# Patient Record
Sex: Female | Born: 2000 | Race: Black or African American | Hispanic: No | Marital: Single | Smoking: Never smoker
Health system: Southern US, Community
[De-identification: ages and names within clinical notes are randomized; demographics above are authoritative.]

---
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
## 2022-06-02 ENCOUNTER — Other Ambulatory Visit: Payer: Self-pay

## 2022-06-02 ENCOUNTER — Emergency Department: Payer: Medicaid Other

## 2022-06-02 ENCOUNTER — Emergency Department
Admission: EM | Admit: 2022-06-02 | Discharge: 2022-06-02 | Disposition: A | Discharge status: Home / Self Care | Payer: Medicaid Other | Attending: Emergency Medicine | Admitting: Emergency Medicine

## 2022-06-02 ENCOUNTER — Encounter: Payer: Self-pay | Admitting: Emergency Medicine

## 2022-06-02 DIAGNOSIS — Z6711 Type A blood, Rh negative: Secondary | ICD-10-CM | POA: Diagnosis not present

## 2022-06-02 DIAGNOSIS — R8281 Pyuria: Secondary | ICD-10-CM | POA: Diagnosis not present

## 2022-06-02 DIAGNOSIS — D649 Anemia, unspecified: Secondary | ICD-10-CM | POA: Insufficient documentation

## 2022-06-02 DIAGNOSIS — N939 Abnormal uterine and vaginal bleeding, unspecified: Secondary | ICD-10-CM | POA: Insufficient documentation

## 2022-06-02 LAB — CBC WITH DIFFERENTIAL/PLATELET
Abs Immature Granulocytes: 0.03 10*3/uL (ref 0.00–0.07)
Basophils Absolute: 0 10*3/uL (ref 0.0–0.1)
Basophils Relative: 0 %
Eosinophils Absolute: 0.2 10*3/uL (ref 0.0–0.5)
Eosinophils Relative: 2 %
HCT: 27 % — ABNORMAL LOW (ref 36.0–46.0)
Hemoglobin: 7.8 g/dL — ABNORMAL LOW (ref 12.0–15.0)
Immature Granulocytes: 0 %
Lymphocytes Relative: 27 %
Lymphs Abs: 2.3 10*3/uL (ref 0.7–4.0)
MCH: 21.8 pg — ABNORMAL LOW (ref 26.0–34.0)
MCHC: 28.9 g/dL — ABNORMAL LOW (ref 30.0–36.0)
MCV: 75.6 fL — ABNORMAL LOW (ref 80.0–100.0)
Monocytes Absolute: 0.5 10*3/uL (ref 0.1–1.0)
Monocytes Relative: 6 %
Neutro Abs: 5.7 10*3/uL (ref 1.7–7.7)
Neutrophils Relative %: 65 %
Platelets: 301 10*3/uL (ref 150–400)
RBC: 3.57 MIL/uL — ABNORMAL LOW (ref 3.87–5.11)
RDW: 15.6 % — ABNORMAL HIGH (ref 11.5–15.5)
WBC: 8.7 10*3/uL (ref 4.0–10.5)
nRBC: 0 % (ref 0.0–0.2)

## 2022-06-02 LAB — URINALYSIS, ROUTINE W REFLEX MICROSCOPIC
RBC / HPF: >50 RBC/hpf — ABNORMAL HIGH (ref 0–5)
Specific Gravity, Urine: >1.03 — ABNORMAL HIGH (ref 1.005–1.030)
WBC, UA: >50 WBC/hpf — ABNORMAL HIGH (ref 0–5)

## 2022-06-02 LAB — TSH: TSH: 2.746 u[IU]/mL (ref 0.350–4.500)

## 2022-06-02 LAB — PREGNANCY, URINE: Preg Test, Ur: NEGATIVE

## 2022-06-02 LAB — BASIC METABOLIC PANEL
Anion gap: 6 (ref 5–15)
BUN: 8 mg/dL (ref 6–20)
CO2: 24 mmol/L (ref 22–32)
Calcium: 8.6 mg/dL — ABNORMAL LOW (ref 8.9–10.3)
Chloride: 108 mmol/L (ref 98–111)
Creatinine, Ser: 0.7 mg/dL (ref 0.44–1.00)
GFR, Estimated: >60 mL/min (ref >60)
Glucose, Bld: 95 mg/dL (ref 70–99)
Potassium: 3.8 mmol/L (ref 3.5–5.1)
Sodium: 138 mmol/L (ref 135–145)

## 2022-06-02 LAB — TYPE AND SCREEN
ABO/RH(D): A NEG
Antibody Screen: NEGATIVE

## 2022-06-02 LAB — HCG, QUANTITATIVE, PREGNANCY: hCG, Beta Chain, Quant, S: <1 m[IU]/mL (ref <5)

## 2022-06-02 MED ORDER — DOCUSATE SODIUM 100 MG PO CAPS: 100.0000 mg | ORAL_CAPSULE | Freq: Two times a day (BID) | ORAL | 0 refills | Status: AC

## 2022-06-02 MED ORDER — FERROUS SULFATE 325 (65 FE) MG PO TBEC: 325.0000 mg | DELAYED_RELEASE_TABLET | Freq: Two times a day (BID) | ORAL | 1 refills | Status: AC

## 2022-06-02 MED ORDER — MEDROXYPROGESTERONE ACETATE 10 MG PO TABS: 20.0000 mg | ORAL_TABLET | Freq: Every day | ORAL | 0 refills | Status: AC

## 2022-06-02 MED ORDER — CEFDINIR 300 MG PO CAPS: 300.0000 mg | ORAL_CAPSULE | Freq: Two times a day (BID) | ORAL | 0 refills | Status: AC

## 2022-06-02 NOTE — Discharge Instructions (Addendum)
Call to get an appointment with an OB/GYN.  Dr. Feliberto Gottron is the gynecologist that is on-call today and his contact information and address are listed on your discharge papers.  Discontinue taking your birth control pills.  Begin taking Provera for the next 7 days.  Also prescription for iron tablets was sent to the pharmacy to be taken twice a day and a prescription for stool softeners to take twice a day as iron can be very constipating.  Return to the emergency department if any worsening of your symptoms.  Your hemoglobin while in the emergency department was higher than what your hemoglobin was at [facility].  You are also being placed on antibiotic as there was bacteria noted in your urine.  Your doctor will be able to see the urine culture.

## 2022-06-02 NOTE — ED Triage Notes (Signed)
Pt to ED via POV with her Mother. Pt was sent from her PCP office for heavy vaginal bleeding x 1 month. Pt mother states that they told them that pts Hgb had dropped and that she may need a blood transfusion. Per Paper work with pt, Hgb was 6.4 by finger stick today. Pt was told at last appt that she was anemic and was given medication for it.   Pt states that she is using 2 pads per hour. Pt states that she is passing blood clots as well. Pt endorses abdominal pain. Pt is currently in NAD.

## 2022-06-02 NOTE — ED Provider Notes (Signed)
[facility] Provider Note    Event Date/Time   First MD Initiated Contact with Patient 06/02/22 1204     (approximate)   History   Vaginal Bleeding   HPI  Jody Reese is a 21 y.o. female presents to the ED after being seen by her PCP office for heavy vaginal bleeding for 1 month.  Patient was told that her hemoglobin in the office was 6.4 by fingerstick today.  Patient currently is taking birth control pills and states that she has not missed any.  No other medications are being taken at this time.  She denies any dizziness, shortness of breath or chest pain.  Patient reports that she is using 2 pads per hour today and has had some abdominal discomfort.  Patient is tolerating food well.  No referral to gynecology has been made so far.     Physical Exam   Triage Vital Signs: ED Triage Vitals [06/02/22 1048]  Enc Vitals Group     BP 118/80     Pulse Rate 84     Resp 16     Temp 99.1 F (37.3 C)     Temp Source Oral     SpO2 100 %     Weight 289 lb (131.1 kg)     Height 5' 3.5" (1.613 m)     Head Circumference      Peak Flow      Pain Score 3     Pain Loc      Pain Edu?      Excl. in [place]?     Most recent vital signs: Vitals:   06/02/22 1405 06/02/22 1455  BP: 120/78 124/76  Pulse: 80   Resp: 16   Temp:    SpO2: 99%      General: Awake, no distress.  CV:  Good peripheral perfusion.  Heart regular rate and rhythm. Resp:  Normal effort.  Lungs are clear bilaterally. Abd:  No distention.  Soft, minimal tenderness noted on palpation.  Markedly obese with BMI 50.39. Other:     ED Results / Procedures / Treatments   Labs (all labs ordered are listed, but only abnormal results are displayed) Labs Reviewed  CBC WITH DIFFERENTIAL/PLATELET - Abnormal; Notable for the following components:      Result Value   RBC 3.57 (*)    Hemoglobin 7.8 (*)    HCT 27.0 (*)    MCV 75.6 (*)    MCH 21.8 (*)    MCHC 28.9 (*)    RDW 15.6 (*)    All  other components within normal limits  BASIC METABOLIC PANEL - Abnormal; Notable for the following components:   Calcium 8.6 (*)    All other components within normal limits  URINALYSIS, ROUTINE W REFLEX MICROSCOPIC - Abnormal; Notable for the following components:   Color, Urine RED (*)    APPearance TURBID (*)    Specific Gravity, Urine >1.030 (*)    Glucose, UA   (*)    Value: TEST NOT REPORTED DUE TO COLOR INTERFERENCE OF URINE PIGMENT   Hgb urine dipstick   (*)    Value: TEST NOT REPORTED DUE TO COLOR INTERFERENCE OF URINE PIGMENT   Bilirubin Urine   (*)    Value: TEST NOT REPORTED DUE TO COLOR INTERFERENCE OF URINE PIGMENT   Ketones, ur   (*)    Value: TEST NOT REPORTED DUE TO COLOR INTERFERENCE OF URINE PIGMENT   Protein, ur   (*)  Value: TEST NOT REPORTED DUE TO COLOR INTERFERENCE OF URINE PIGMENT   Nitrite   (*)    Value: TEST NOT REPORTED DUE TO COLOR INTERFERENCE OF URINE PIGMENT   Leukocytes,Ua   (*)    Value: TEST NOT REPORTED DUE TO COLOR INTERFERENCE OF URINE PIGMENT   RBC / HPF >50 (*)    WBC, UA >50 (*)    Bacteria, UA MANY (*)    All other components within normal limits  URINE CULTURE  TSH  HCG, QUANTITATIVE, PREGNANCY  PREGNANCY, URINE  POC URINE PREG, ED  TYPE AND SCREEN      RADIOLOGY  Pelvic ultrasound per radiologist was negative.   PROCEDURES:  Critical Care performed:   Procedures   MEDICATIONS ORDERED IN ED: Medications - No data to display   IMPRESSION / MDM / ASSESSMENT AND PLAN / ED COURSE  I reviewed the triage vital signs and the nursing notes.   Differential diagnosis includes, but is not limited to, abnormal uterine bleeding, anemia, pyuria, urinary tract infection.  21 year old female presents to the ED with history of vaginal bleeding for 1 month and being followed by her PCP at [facility].  She was told today that her hemoglobin fingerstick is 6.4 and sent to the ED for reevaluation.  Urinalysis showed greater  than 50 WBCs with many bacteria however there was also blood present which made it difficult for the microscopic.  Pregnancy test was negative and beta hCG was less than 1.  TSH was 2.746.  ABO/Rh was A negative.  BMP unremarkable.  CBC showed hemoglobin of 7.8, hematocrit 27.  I discussed these findings with Dr. Scotty Court and patient was observed during the time that she was in the emergency department.  Patient was ambulatory without any assistance and with no difficulties.  Patient was instructed to discontinue taking her birth control pills which were low-dose tablet and a prescription for Provera 20 mg 1 daily for the next 7 days was prescribed along with a prescription for iron tablets to be taken twice a day, Colace twice a day to prevent constipation from the iron and a prescription for [facility] for the urine.  A urine culture was also ordered to follow-up the greater than 50,000 WBCs.  Patient was referred to Dr. Francesca Oman office who is on-call for gynecology today.  Patient was given return precautions should she develop any worsening of her symptoms, dizziness, shortness of breath, chest pain or worsening of her bleeding.      Patient's presentation is most consistent with acute complicated illness / injury requiring diagnostic workup.  FINAL CLINICAL IMPRESSION(S) / ED DIAGNOSES   Final diagnoses:  Abnormal uterine bleeding  Pyuria     Rx / DC Orders   ED Discharge Orders          Ordered    medroxyPROGESTERone (PROVERA) 10 MG tablet  Daily        06/02/22 1512    ferrous sulfate 325 (65 FE) MG EC tablet  2 times daily        06/02/22 1512    docusate sodium (COLACE) 100 MG capsule  2 times daily        06/02/22 1512    cefdinir (OMNICEF) 300 MG capsule  2 times daily        06/02/22 1519             Note:  This document was prepared using Dragon voice recognition software and may include unintentional dictation errors.  Tommi Rumps, PA-C 06/02/22 1552     Sharman Cheek, MD 06/02/22 [id number]

## 2022-06-02 NOTE — ED Provider Triage Note (Signed)
Emergency Medicine Provider Triage Evaluation Note  Jody Reese , a 21 y.o. female  was evaluated in triage.  Pt complains of heavy vaginal bleeding.  Seen by her physician at [facility] and was told her hemoglobin was very low and may need transfusion.  Review of Systems  Positive: Heavy vaginal bleeding Negative: Fever  Physical Exam  BP 118/80 (BP Location: Left Arm)   Pulse 84   Temp 99.1 F (37.3 C) (Oral)   Resp 16   Ht 5' 3.5" (1.613 m)   Wt 131.1 kg   SpO2 100%   BMI 50.39 kg/m  Gen:   Awake, no distress   Resp:  Normal effort  MSK:   Moves extremities without difficulty  Other:    Medical Decision Making  Medically screening exam initiated at 11:17 AM.  Appropriate orders placed.  Davina Poke was informed that the remainder of the evaluation will be completed by another provider, this initial triage assessment does not replace that evaluation, and the importance of remaining in the ED until their evaluation is complete.  Labs ordered, ultrasound ordered   Faythe Ghee, PA-C 06/02/22 1118

## 2022-06-02 NOTE — ED Notes (Addendum)
ORTHOSTATIC VS Laying HR 90 BP 118/56 Sitting HR 87 BP 68/55 Standing HR 91 BP 127/76  Pt. Reports no change in feeling or light- headedness/dizziness. Dr. Scotty Court notified of pt's results.

## 2022-06-03 LAB — URINE CULTURE: Culture: NO GROWTH

## 2022-08-13 ENCOUNTER — Encounter: Payer: Self-pay | Admitting: Emergency Medicine

## 2022-08-13 ENCOUNTER — Emergency Department
Admission: EM | Admit: 2022-08-13 | Discharge: 2022-08-17 | Disposition: A | Discharge status: Home / Self Care | Payer: Medicaid Other | Attending: Emergency Medicine | Admitting: Emergency Medicine

## 2022-08-13 ENCOUNTER — Other Ambulatory Visit: Payer: Self-pay

## 2022-08-13 DIAGNOSIS — R1032 Left lower quadrant pain: Secondary | ICD-10-CM | POA: Insufficient documentation

## 2022-08-13 DIAGNOSIS — Z5321 Procedure and treatment not carried out due to patient leaving prior to being seen by health care provider: Secondary | ICD-10-CM | POA: Insufficient documentation

## 2022-08-13 LAB — CBC WITH DIFFERENTIAL/PLATELET
Abs Immature Granulocytes: 0.03 10*3/uL (ref 0.00–0.07)
Basophils Absolute: 0 10*3/uL (ref 0.0–0.1)
Basophils Relative: 0 %
Eosinophils Absolute: 0.1 10*3/uL (ref 0.0–0.5)
Eosinophils Relative: 2 %
HCT: 29.9 % — ABNORMAL LOW (ref 36.0–46.0)
Hemoglobin: 8.1 g/dL — ABNORMAL LOW (ref 12.0–15.0)
Immature Granulocytes: 0 %
Lymphocytes Relative: 28 %
Lymphs Abs: 2.5 10*3/uL (ref 0.7–4.0)
MCH: 18.3 pg — ABNORMAL LOW (ref 26.0–34.0)
MCHC: 27.1 g/dL — ABNORMAL LOW (ref 30.0–36.0)
MCV: 67.6 fL — ABNORMAL LOW (ref 80.0–100.0)
Monocytes Absolute: 0.4 10*3/uL (ref 0.1–1.0)
Monocytes Relative: 4 %
Neutro Abs: 5.9 10*3/uL (ref 1.7–7.7)
Neutrophils Relative %: 66 %
Platelets: 291 10*3/uL (ref 150–400)
RBC: 4.42 MIL/uL (ref 3.87–5.11)
RDW: 17.8 % — ABNORMAL HIGH (ref 11.5–15.5)
Smear Review: NORMAL
WBC: 8.9 10*3/uL (ref 4.0–10.5)
nRBC: 0 % (ref 0.0–0.2)

## 2022-08-13 LAB — URINALYSIS, ROUTINE W REFLEX MICROSCOPIC
Bilirubin Urine: NEGATIVE
Glucose, UA: NEGATIVE mg/dL
Ketones, ur: NEGATIVE mg/dL
Nitrite: NEGATIVE
Protein, ur: NEGATIVE mg/dL
RBC / HPF: >50 RBC/hpf — ABNORMAL HIGH (ref 0–5)
Specific Gravity, Urine: 1.016 (ref 1.005–1.030)
WBC, UA: >50 WBC/hpf — ABNORMAL HIGH (ref 0–5)
pH: 7 (ref 5.0–8.0)

## 2022-08-13 LAB — COMPREHENSIVE METABOLIC PANEL
ALT: 19 U/L (ref 0–44)
AST: 20 U/L (ref 15–41)
Albumin: 3.8 g/dL (ref 3.5–5.0)
Alkaline Phosphatase: 74 U/L (ref 38–126)
Anion gap: 6 (ref 5–15)
BUN: 5 mg/dL — ABNORMAL LOW (ref 6–20)
CO2: 24 mmol/L (ref 22–32)
Calcium: 9 mg/dL (ref 8.9–10.3)
Chloride: 107 mmol/L (ref 98–111)
Creatinine, Ser: 0.74 mg/dL (ref 0.44–1.00)
GFR, Estimated: >60 mL/min (ref >60)
Glucose, Bld: 116 mg/dL — ABNORMAL HIGH (ref 70–99)
Potassium: 3.5 mmol/L (ref 3.5–5.1)
Sodium: 137 mmol/L (ref 135–145)
Total Bilirubin: 0.7 mg/dL (ref 0.3–1.2)
Total Protein: 7.7 g/dL (ref 6.5–8.1)

## 2022-08-13 LAB — LIPASE, BLOOD: Lipase: 40 U/L (ref 11–51)

## 2022-08-13 LAB — PREGNANCY, URINE: Preg Test, Ur: NEGATIVE

## 2022-08-13 NOTE — ED Triage Notes (Signed)
Pt to ED via POV c/o left upper side pain. Pt states that it started this morning. Pt denies any other symptoms at this time. Pt is in NAD.

## 2022-08-13 NOTE — ED Provider Triage Note (Signed)
  Emergency Medicine Provider Triage Evaluation Note  Jody Reese , a 21 y.o.female,  was evaluated in triage.  Pt complains of sudden onset left-sided flank pain started earlier today.  Denies any nausea, vomiting, or urinary symptoms at this time.  She states that this is never happened to her before.  She states that she is having difficulty finding a comfortable position.   Review of Systems  Positive: Left-sided flank pain. Negative: Denies fever, chest pain, vomiting  Physical Exam   Vitals:   08/13/22 1428  BP: (!) 110/92  Pulse: 100  Resp: 18  Temp: 98.3 F (36.8 C)  SpO2: 99%   Gen:   Awake, no distress   Resp:  Normal effort  MSK:   Moves extremities without difficulty  Other:  Left-sided CVAT.  Medical Decision Making  Given the patient's initial medical screening exam, the following diagnostic evaluation has been ordered. The patient will be placed in the appropriate treatment space, once one is available, to complete the evaluation and treatment. I have discussed the plan of care with the patient and I have advised the patient that an ED physician or mid-level practitioner will reevaluate their condition after the test results have been received, as the results may give them additional insight into the type of treatment they may need.    Diagnostics: Labs, UA, CT renal.  Treatments: none immediately   Teodoro Spray, [place] 08/13/22 1436

## 2022-08-13 NOTE — ED Notes (Signed)
Called pt's several times no answer

## 2022-12-14 ENCOUNTER — Emergency Department
Admission: EM | Admit: 2022-12-14 | Discharge: 2022-12-15 | Disposition: A | Discharge status: Home / Self Care | Payer: Medicaid Other | Attending: Emergency Medicine | Admitting: Emergency Medicine

## 2022-12-14 ENCOUNTER — Emergency Department: Payer: Medicaid Other

## 2022-12-14 DIAGNOSIS — E876 Hypokalemia: Secondary | ICD-10-CM | POA: Diagnosis not present

## 2022-12-14 DIAGNOSIS — B349 Viral infection, unspecified: Secondary | ICD-10-CM

## 2022-12-14 DIAGNOSIS — R079 Chest pain, unspecified: Secondary | ICD-10-CM | POA: Diagnosis present

## 2022-12-14 LAB — BASIC METABOLIC PANEL
Anion gap: 9 (ref 5–15)
BUN: 6 mg/dL (ref 6–20)
CO2: 22 mmol/L (ref 22–32)
Calcium: 8.5 mg/dL — ABNORMAL LOW (ref 8.9–10.3)
Chloride: 104 mmol/L (ref 98–111)
Creatinine, Ser: 0.9 mg/dL (ref 0.44–1.00)
GFR, Estimated: >60 mL/min (ref >60)
Glucose, Bld: 93 mg/dL (ref 70–99)
Potassium: 3 mmol/L — ABNORMAL LOW (ref 3.5–5.1)
Sodium: 135 mmol/L (ref 135–145)

## 2022-12-14 LAB — CBC
HCT: 30.5 % — ABNORMAL LOW (ref 36.0–46.0)
Hemoglobin: 8.5 g/dL — ABNORMAL LOW (ref 12.0–15.0)
MCH: 18.2 pg — ABNORMAL LOW (ref 26.0–34.0)
MCHC: 27.9 g/dL — ABNORMAL LOW (ref 30.0–36.0)
MCV: 65.5 fL — ABNORMAL LOW (ref 80.0–100.0)
Platelets: 190 10*3/uL (ref 150–400)
RBC: 4.66 MIL/uL (ref 3.87–5.11)
RDW: 17.7 % — ABNORMAL HIGH (ref 11.5–15.5)
WBC: 4.3 10*3/uL (ref 4.0–10.5)
nRBC: 0 % (ref 0.0–0.2)

## 2022-12-14 LAB — TROPONIN I (HIGH SENSITIVITY): Troponin I (High Sensitivity): 4 ng/L (ref <18)

## 2022-12-14 LAB — POC URINE PREG, ED: Preg Test, Ur: NEGATIVE

## 2022-12-14 MED ORDER — ONDANSETRON 4 MG PO TBDP
4.0000 mg | ORAL_TABLET | Freq: Once | ORAL | Status: AC
  Administered 2022-12-14: 4 mg via ORAL
  Filled 2022-12-14: qty 1

## 2022-12-14 MED ORDER — KETOROLAC TROMETHAMINE 15 MG/ML IJ SOLN
15.0000 mg | Freq: Once | INTRAMUSCULAR | Status: AC
  Administered 2022-12-14: 15 mg via INTRAMUSCULAR
  Filled 2022-12-14: qty 1

## 2022-12-14 MED ORDER — POTASSIUM CHLORIDE 20 MEQ PO PACK
40.0000 meq | PACK | Freq: Two times a day (BID) | ORAL | Status: DC
  Administered 2022-12-14: 40 meq via ORAL

## 2022-12-14 MED ORDER — POTASSIUM CHLORIDE CRYS ER 20 MEQ PO TBCR
40.0000 meq | EXTENDED_RELEASE_TABLET | Freq: Once | ORAL | Status: AC
  Administered 2022-12-14: 40 meq via ORAL
  Filled 2022-12-14: qty 2

## 2022-12-14 MED ORDER — IBUPROFEN 400 MG PO TABS
400.0000 mg | ORAL_TABLET | Freq: Once | ORAL | Status: AC
  Administered 2022-12-14: 400 mg via ORAL
  Filled 2022-12-14: qty 1

## 2022-12-14 MED ORDER — POTASSIUM CHLORIDE CRYS ER 20 MEQ PO TBCR: 40.0000 meq | EXTENDED_RELEASE_TABLET | Freq: Once | ORAL | Status: DC

## 2022-12-14 MED ORDER — ONDANSETRON 4 MG PO TBDP: 4.0000 mg | ORAL_TABLET | Freq: Three times a day (TID) | ORAL | 0 refills | Status: AC | PRN

## 2022-12-14 MED ORDER — POTASSIUM CHLORIDE CRYS ER 20 MEQ PO TBCR: 20.0000 meq | EXTENDED_RELEASE_TABLET | Freq: Two times a day (BID) | ORAL | 0 refills | Status: AC

## 2022-12-14 MED ORDER — ACETAMINOPHEN 500 MG PO TABS
1000.0000 mg | ORAL_TABLET | Freq: Once | ORAL | Status: AC
  Administered 2022-12-14: 1000 mg via ORAL
  Filled 2022-12-14: qty 2

## 2022-12-14 NOTE — Discharge Instructions (Addendum)
I think that you likely have a viral illness like influenza.  Take Tylenol and Motrin for body aches and headache.  Your potassium was low.  Please review the information sheet about dietary sources of potassium.  If you are able to tolerate you can take the potassium supplement twice a day for the next 2 days.  You can take the Zofran as needed for nausea and vomiting.

## 2022-12-14 NOTE — ED Provider Notes (Addendum)
[facility] Provider Note    Event Date/Time   First MD Initiated Contact with Patient 12/14/22 2148     (approximate)   History   Chest Pain   HPI  Jody Reese is a 22 y.o. female past medical history of heavy menstrual bleeding chronic anemia who presents because of chest pain headache.  Symptoms started yesterday.  She endorses runny nose mild cough and sharp pain in the chest.  Pain is been fairly constant is nonpleuritic nonexertional.  Does feel short of breath.  She is also had headache since yesterday it is frontal without vision change numbness tingling weakness.  Has had some nausea and vomiting today no diarrhea no belly pain.  No sick contacts.  Denies fevers. The patient denies hx of prior DVT/PE, unilateral leg pain/swelling, hormone use, recent surgery, hx of cancer, prolonged immobilization, or hemoptysis.       No past medical history on file.  There are no problems to display for this patient.    Physical Exam  Triage Vital Signs: ED Triage Vitals [12/14/22 2126]  Enc Vitals Group     BP      Pulse      Resp      Temp      Temp src      SpO2      Weight 300 lb (136.1 kg)     Height      Head Circumference      Peak Flow      Pain Score 7     Pain Loc      Pain Edu?      Excl. in [place]?     Most recent vital signs: Vitals:   12/14/22 2200  BP: 108/64  Pulse: 86  Resp: 19  Temp: 98.3 F (36.8 C)  SpO2: 100%     General: Awake, no distress.  CV:  Good peripheral perfusion. No edema Resp:  Normal effort. Lungs are clear Abd:  No distention.  Neuro:             Awake, Alert, Oriented x 3  Other:  Aox3, nml speech  PERRL, EOMI, face symmetric, nml tongue movement  5/5 strength in the BL upper and lower extremities  Sensation grossly intact in the BL upper and lower extremities  Finger-nose-finger intact BL    ED Results / Procedures / Treatments  Labs (all labs ordered are listed, but only abnormal results  are displayed) Labs Reviewed  BASIC METABOLIC PANEL - Abnormal; Notable for the following components:      Result Value   Potassium 3.0 (*)    Calcium 8.5 (*)    All other components within normal limits  CBC - Abnormal; Notable for the following components:   Hemoglobin 8.5 (*)    HCT 30.5 (*)    MCV 65.5 (*)    MCH 18.2 (*)    MCHC 27.9 (*)    RDW 17.7 (*)    All other components within normal limits  POC URINE PREG, ED  TROPONIN I (HIGH SENSITIVITY)     EKG  EKG interpretation performed by myself: NSR, RAD, nml intervals, no acute ischemic changes    RADIOLOGY I reviewed and interpreted the CXR which does not show any acute cardiopulmonary process   PROCEDURES:  Critical Care performed: No  Procedures    MEDICATIONS ORDERED IN ED: Medications  potassium chloride (KLOR-CON) packet 40 mEq (40 mEq Oral Given 12/14/22 2320)  ondansetron (ZOFRAN-ODT) disintegrating tablet  4 mg (4 mg Oral Given 12/14/22 2236)  ibuprofen (ADVIL) tablet 400 mg (400 mg Oral Given 12/14/22 2236)  acetaminophen (TYLENOL) tablet 1,000 mg (1,000 mg Oral Given 12/14/22 2235)  potassium chloride SA (KLOR-CON M) CR tablet 40 mEq (40 mEq Oral Given 12/14/22 2236)  ondansetron (ZOFRAN-ODT) disintegrating tablet 4 mg (4 mg Oral Given 12/14/22 2252)  ketorolac (TORADOL) 15 MG/ML injection 15 mg (15 mg Intramuscular Given 12/14/22 2320)     IMPRESSION / MDM / ASSESSMENT AND PLAN / ED COURSE  I reviewed the triage vital signs and the nursing notes.                              Patient's presentation is most consistent with acute complicated illness / injury requiring diagnostic workup.  Differential diagnosis includes, but is not limited to, viral illness including COVID-19, influenza, pleurisy, pneumonia, pneumothorax, pulmonary embolism, less likely ACS, myocarditis, pericarditis  The patient is a 22 year old female presents with headache chest pain since yesterday.  The chest pain is sharp is  nonpleuritic nonexertional and constant associated with dyspnea cough runny nose.  She has also had bifrontal headache without vision change numbness weakness.  This is associated with vomiting no diarrhea.  Patient's vitals are reassuring she is afebrile satting 100% on room air.  She looks overall well does have nasal congestion but lung sounds are clear she is not dyspneic.  Neurologic exam is normal she has no meningismus.  EKG shows right axis deviation but otherwise no acute ischemic changes.  Chest x-ray is clear.  Labs are notable for chronic anemia with hemoglobin of 8.5 with microcytosis but this is stable from prior.  She is also hypokalemic with potassium of 3 this was supplemented orally.  Overall my suspicion is that this is likely a viral syndrome given the constellation of symptoms.  Low suspicion for acute life-threatening cause of her chest pain including pulmonary embolism.  Did consider this diagnosis but she is PERC negative she is not hypoxic not tachycardic and with the other symptoms feel it is more likely part of a viral illness.  Terms of her headache I have low suspicion for subarachnoid or meningitis given overall clinical appearance.  Plan to treat supportively with Tylenol Motrin and Zofran.  Patient vomited the potassium supplementation along with the Tylenol Motrin.  She is not sure whether she kept on the Zofran.  Will give another ODT Zofran IM Toradol and after Zofran see if she can tolerate potassium.    Patient tolerated p.o. after ODT Zofran.  Attempted to give her potassium packet instead of the pill but she regurgitated this as well.  Says it was because of the juice that she was given with.  At this point I do not think she is not tolerating p.o. I just think the oral potassium is just not sitting well with her.  Will give her information about dietary sources of potassium.  Will prescribe ODT Zofran.  We discussed return precautions.  She is appropriate for  discharge.  FINAL CLINICAL IMPRESSION(S) / ED DIAGNOSES   Final diagnoses:  Viral illness  Hypokalemia     Rx / DC Orders   ED Discharge Orders          Ordered    ondansetron (ZOFRAN-ODT) 4 MG disintegrating tablet  Every 8 hours PRN        12/14/22 2331  Note:  This document was prepared using Dragon voice recognition software and may include unintentional dictation errors.   Rada Hay, MD 12/14/22 [id number]    Rada Hay, MD 12/14/22 [id number]

## 2022-12-14 NOTE — ED Triage Notes (Addendum)
Pt sts that she has been having headache and chest pain for the last three days. Pt falling asleep in triage.

## 2022-12-28 ENCOUNTER — Other Ambulatory Visit: Payer: Self-pay

## 2022-12-28 ENCOUNTER — Emergency Department: Payer: Medicaid Other

## 2022-12-28 ENCOUNTER — Encounter: Payer: Self-pay | Admitting: Emergency Medicine

## 2022-12-28 ENCOUNTER — Emergency Department
Admission: EM | Admit: 2022-12-28 | Discharge: 2022-12-28 | Disposition: A | Discharge status: Home / Self Care | Payer: Medicaid Other | Attending: Emergency Medicine | Admitting: Emergency Medicine

## 2022-12-28 DIAGNOSIS — S20219A Contusion of unspecified front wall of thorax, initial encounter: Secondary | ICD-10-CM | POA: Diagnosis not present

## 2022-12-28 DIAGNOSIS — S8001XA Contusion of right knee, initial encounter: Secondary | ICD-10-CM | POA: Diagnosis not present

## 2022-12-28 DIAGNOSIS — Y92410 Unspecified street and highway as the place of occurrence of the external cause: Secondary | ICD-10-CM | POA: Insufficient documentation

## 2022-12-28 DIAGNOSIS — M25561 Pain in right knee: Secondary | ICD-10-CM | POA: Diagnosis present

## 2022-12-28 MED ORDER — ACETAMINOPHEN 325 MG PO TABS
650.0000 mg | ORAL_TABLET | Freq: Once | ORAL | Status: AC
  Administered 2022-12-28: 650 mg via ORAL
  Filled 2022-12-28: qty 2
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
## 2022-12-28 NOTE — Discharge Instructions (Signed)
Your x-rays are normal.  You may continue to take Tylenol/ibuprofen per package instructions to help with your symptoms.  It was a pleasure caring for you today.

## 2023-05-16 IMAGING — US US ABDOMEN COMPLETE
1 series · 15 of 25 positions shown · non-contrast
Comparison: None.

CLINICAL DATA: Left-sided abdominal pain

EXAM:
ABDOMEN ULTRASOUND COMPLETE

[Series 1: us abdomen complete · 15 of 61 slices shown]
[im 1/61]
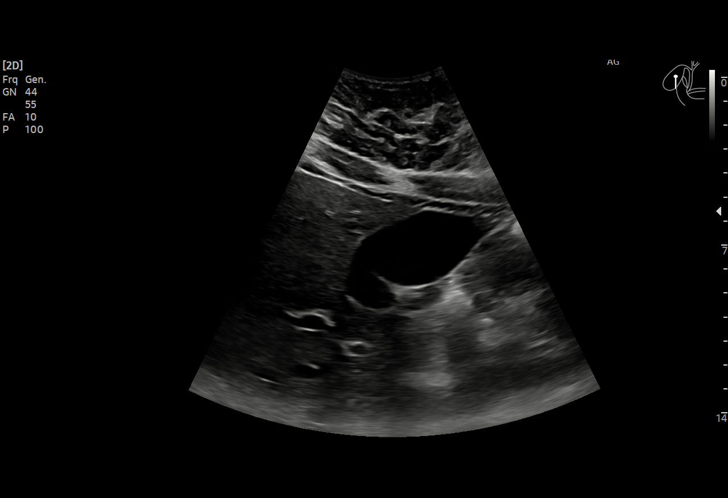
[im 6/61]
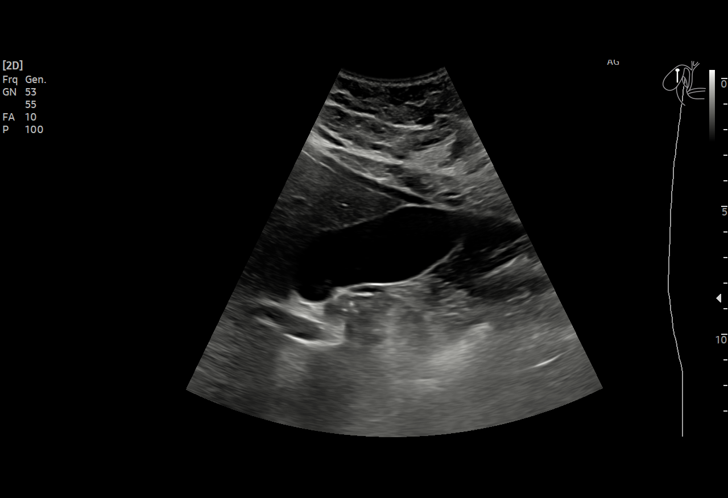
[im 11/61]
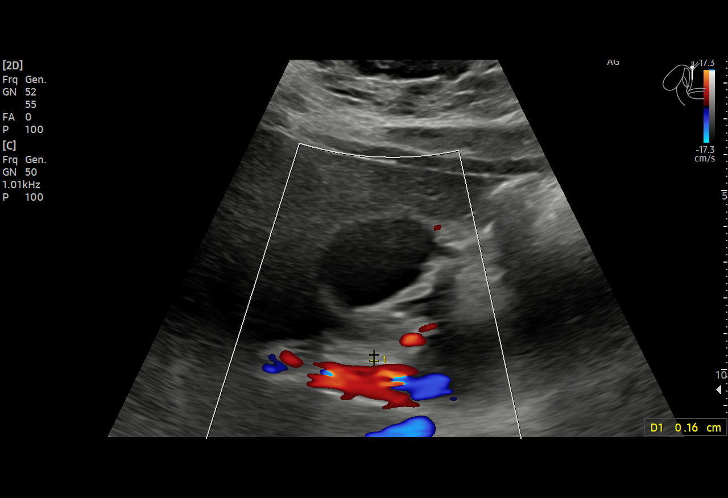
[im 13/61]
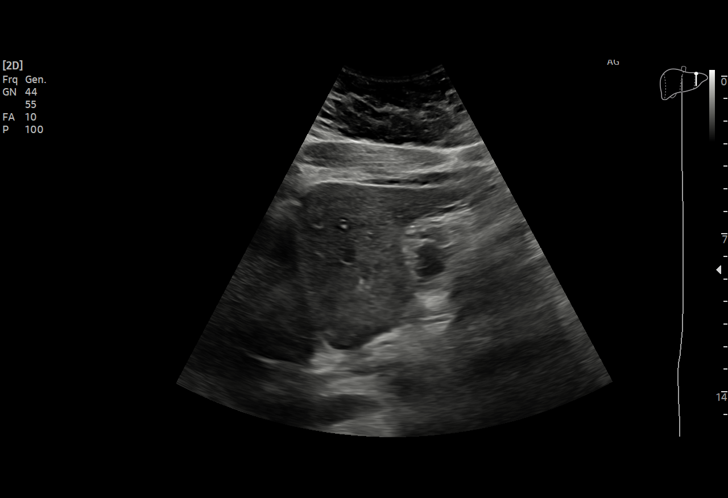
[im 18/61]
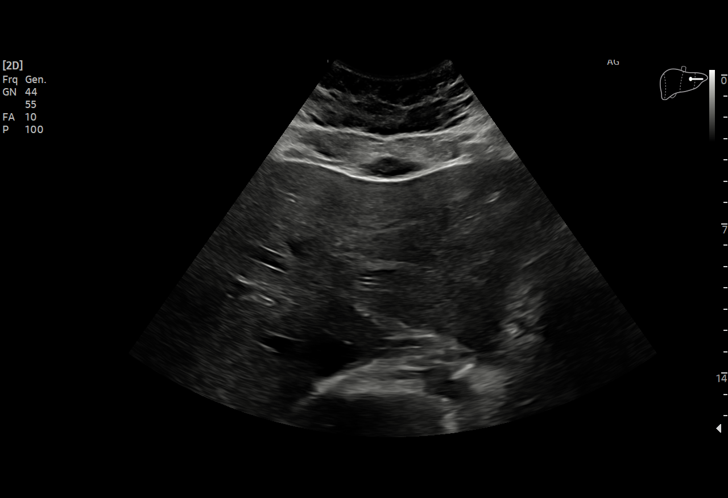
[im 23/61]
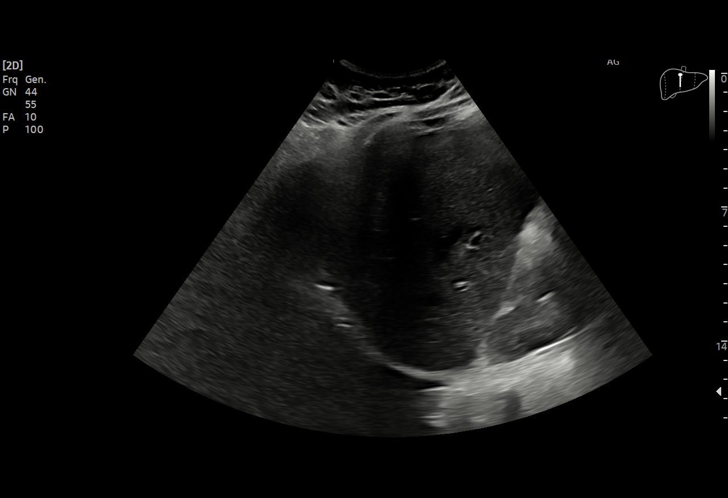
[im 26/61]
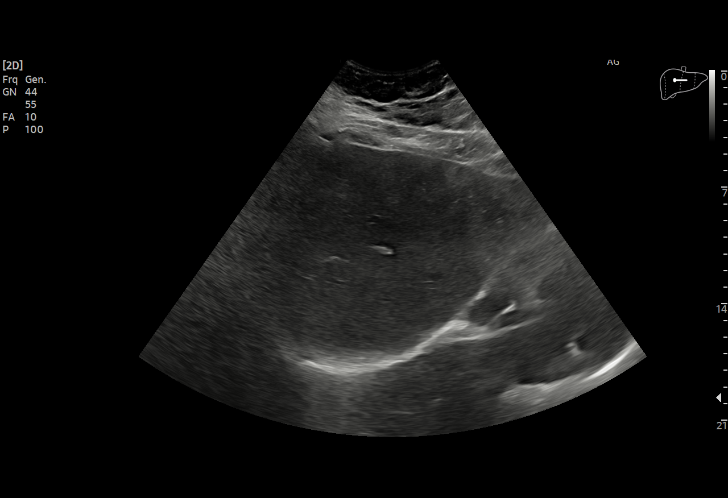
[im 31/61]
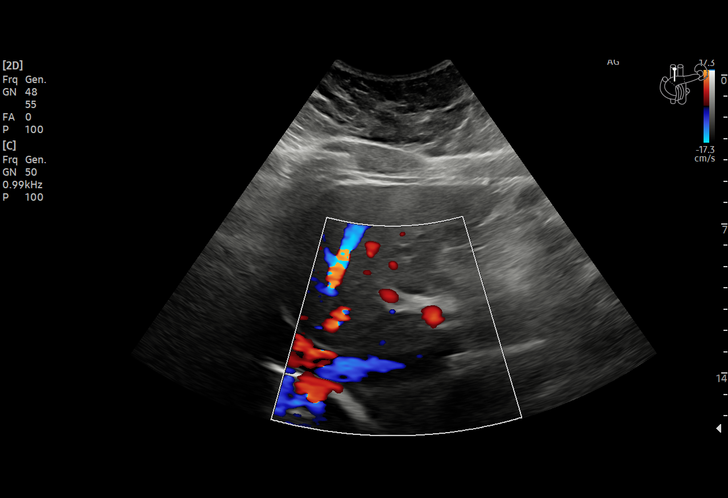
[im 36/61]
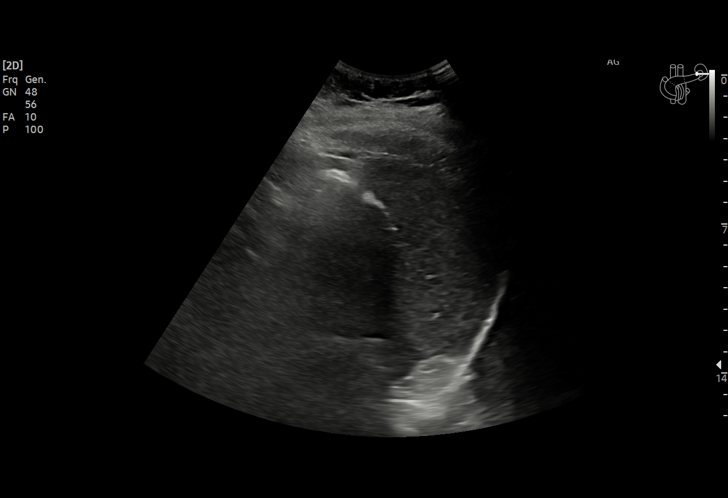
[im 38/61]
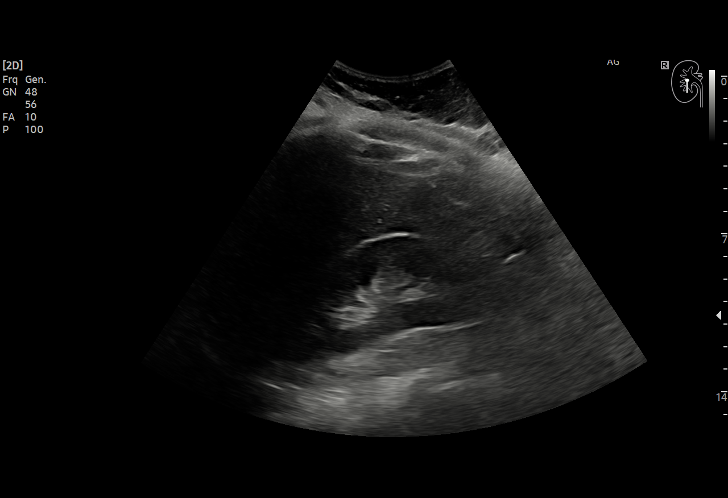
[im 43/61]
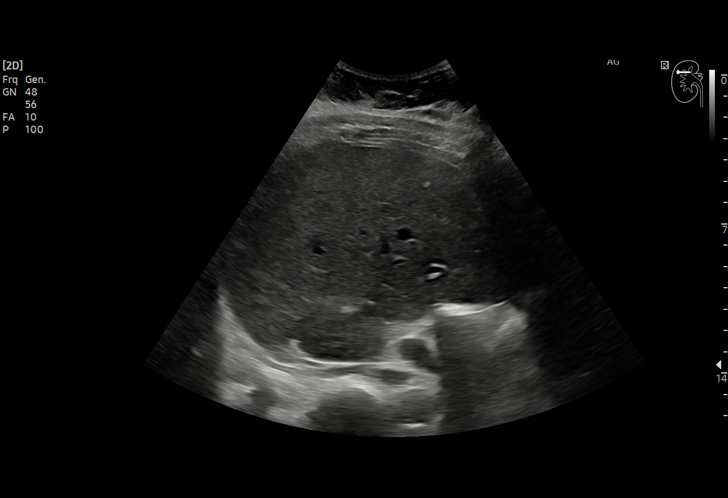
[im 48/61]
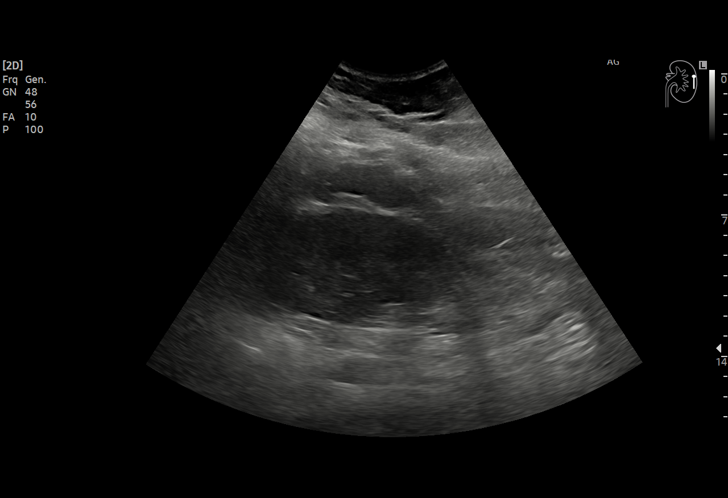
[im 51/61]
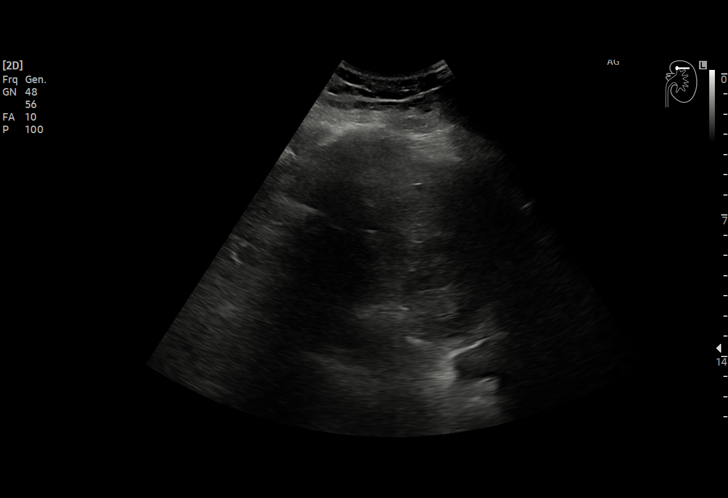
[im 56/61]
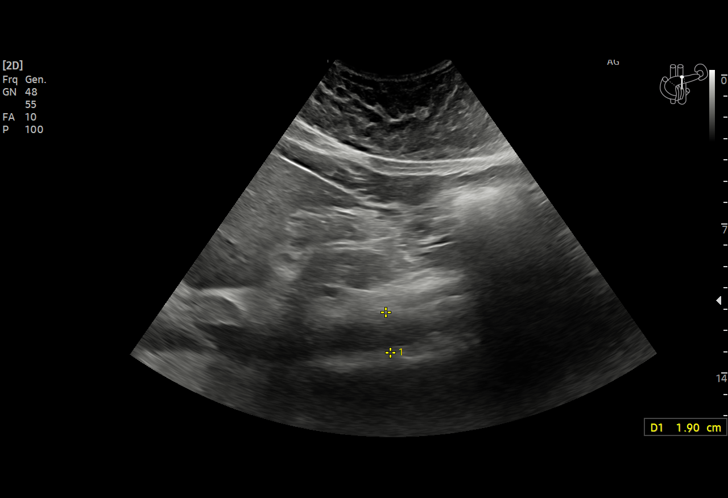
[im 61/61]
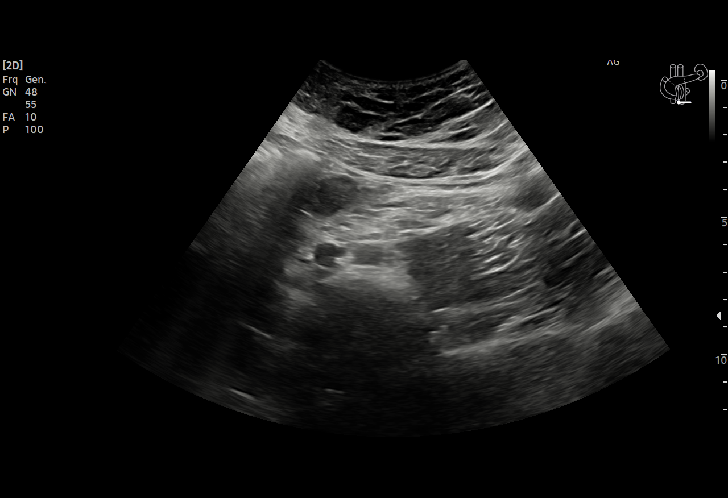

[15 of 25 positions shown; findings below may reference images not displayed]

FINDINGS: Gallbladder: No gallstones or wall thickening visualized. No
sonographic Murphy sign noted by sonographer.

Common bile duct: Diameter: 2 mm

Liver: No focal lesion identified. Within normal limits in
parenchymal echogenicity. Portal vein is patent on color Doppler
imaging with normal direction of blood flow towards the liver.

IVC: No abnormality visualized.

Pancreas: Visualized portion unremarkable.

Spleen: Size and appearance within normal limits.

Right Kidney: Length: 10.3 cm. Echogenicity within normal limits. No
mass or hydronephrosis visualized.

Left Kidney: Length: 10.2 cm. Echogenicity within normal limits. No
mass or hydronephrosis visualized.

Abdominal aorta: No aneurysm visualized.

Other findings: None.
IMPRESSION: No acute abnormality noted.

## 2023-05-24 ENCOUNTER — Emergency Department: Payer: Medicaid Other

## 2023-05-24 ENCOUNTER — Other Ambulatory Visit: Payer: Self-pay

## 2023-05-24 DIAGNOSIS — Y92481 Parking lot as the place of occurrence of the external cause: Secondary | ICD-10-CM | POA: Insufficient documentation

## 2023-05-24 DIAGNOSIS — M791 Myalgia, unspecified site: Secondary | ICD-10-CM | POA: Insufficient documentation

## 2023-05-24 DIAGNOSIS — R519 Headache, unspecified: Secondary | ICD-10-CM | POA: Insufficient documentation

## 2023-05-24 DIAGNOSIS — M25531 Pain in right wrist: Secondary | ICD-10-CM | POA: Diagnosis present

## 2023-05-24 DIAGNOSIS — R0789 Other chest pain: Secondary | ICD-10-CM | POA: Diagnosis not present
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
## 2023-05-25 ENCOUNTER — Emergency Department
Admission: EM | Admit: 2023-05-25 | Discharge: 2023-05-25 | Disposition: A | Discharge status: Home / Self Care | Payer: Medicaid Other | Attending: Emergency Medicine | Admitting: Emergency Medicine

## 2023-05-25 DIAGNOSIS — M7918 Myalgia, other site: Secondary | ICD-10-CM

## 2023-05-25 MED ORDER — KETOROLAC TROMETHAMINE 30 MG/ML IJ SOLN
30.0000 mg | Freq: Once | INTRAMUSCULAR | Status: AC
  Administered 2023-05-25: 30 mg via INTRAMUSCULAR
  Filled 2023-05-25: qty 1

## 2023-05-25 NOTE — Discharge Instructions (Addendum)
Please take Tylenol and ibuprofen/Advil for your pain.  It is safe to take them together, or to alternate them every few hours.  Take up to 1000mg of Tylenol at a time, up to 4 times per day.  Do not take more than 4000 mg of Tylenol in 24 hours.  For ibuprofen, take 400-600 mg, 3 - 4 times per day.  
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
## 2023-05-26 ENCOUNTER — Encounter: Payer: Self-pay | Admitting: Emergency Medicine

## 2023-05-26 ENCOUNTER — Emergency Department
Admission: EM | Admit: 2023-05-26 | Discharge: 2023-05-26 | Disposition: A | Discharge status: Home / Self Care | Payer: Medicaid Other | Attending: Emergency Medicine | Admitting: Emergency Medicine

## 2023-05-26 ENCOUNTER — Other Ambulatory Visit: Payer: Self-pay

## 2023-05-26 DIAGNOSIS — R0789 Other chest pain: Secondary | ICD-10-CM | POA: Diagnosis present

## 2023-05-26 MED ORDER — LIDOCAINE 5 % EX PTCH: 1.0000 | MEDICATED_PATCH | Freq: Two times a day (BID) | CUTANEOUS | 0 refills | Status: AC

## 2023-05-26 NOTE — Discharge Instructions (Signed)
Please continue to take Tylenol/ibuprofen per package instructions as needed for pain in addition to the Lidoderm patches.  Please return for any new, worsening, or change in symptoms or other concerns.
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
